# Patient Record
Sex: Male | Born: 1960 | Race: White | Hispanic: No | Marital: Married | Smoking: Never smoker
Health system: Southern US, Community
[De-identification: ages and names within clinical notes are randomized; demographics above are authoritative.]

## PROBLEM LIST (undated history)

## (undated) DIAGNOSIS — G4730 Sleep apnea, unspecified: Secondary | ICD-10-CM

## (undated) DIAGNOSIS — G43909 Migraine, unspecified, not intractable, without status migrainosus: Secondary | ICD-10-CM

## (undated) DIAGNOSIS — G2581 Restless legs syndrome: Secondary | ICD-10-CM

## (undated) DIAGNOSIS — I10 Essential (primary) hypertension: Secondary | ICD-10-CM

## (undated) DIAGNOSIS — J45909 Unspecified asthma, uncomplicated: Secondary | ICD-10-CM

## (undated) HISTORY — DX: Unspecified asthma, uncomplicated: J45.909

## (undated) HISTORY — PX: NASAL SEPTUM SURGERY: SHX37

---

## 2017-09-03 ENCOUNTER — Encounter (HOSPITAL_COMMUNITY): Payer: Self-pay | Admitting: Emergency Medicine

## 2017-09-03 DIAGNOSIS — Z79899 Other long term (current) drug therapy: Secondary | ICD-10-CM | POA: Insufficient documentation

## 2017-09-03 DIAGNOSIS — R569 Unspecified convulsions: Secondary | ICD-10-CM | POA: Insufficient documentation

## 2017-09-03 DIAGNOSIS — R55 Syncope and collapse: Secondary | ICD-10-CM | POA: Diagnosis not present

## 2017-09-03 LAB — BASIC METABOLIC PANEL
ANION GAP: 8 (ref 5–15)
BUN: 16 mg/dL (ref 6–20)
CHLORIDE: 105 mmol/L (ref 101–111)
CO2: 21 mmol/L — ABNORMAL LOW (ref 22–32)
Calcium: 8.7 mg/dL — ABNORMAL LOW (ref 8.9–10.3)
Creatinine, Ser: 1.28 mg/dL — ABNORMAL HIGH (ref 0.61–1.24)
GFR calc Af Amer: >60 mL/min (ref >60)
Glucose, Bld: 99 mg/dL (ref 65–99)
POTASSIUM: 3.6 mmol/L (ref 3.5–5.1)
SODIUM: 134 mmol/L — AB (ref 135–145)

## 2017-09-03 LAB — CBC
HEMATOCRIT: 43.6 % (ref 39.0–52.0)
HEMOGLOBIN: 15.3 g/dL (ref 13.0–17.0)
MCH: 31.1 pg (ref 26.0–34.0)
MCHC: 35.1 g/dL (ref 30.0–36.0)
MCV: 88.6 fL (ref 78.0–100.0)
Platelets: 244 10*3/uL (ref 150–400)
RBC: 4.92 MIL/uL (ref 4.22–5.81)
RDW: 12.5 % (ref 11.5–15.5)
WBC: 10.6 10*3/uL — AB (ref 4.0–10.5)

## 2017-09-03 LAB — CBG MONITORING, ED: GLUCOSE-CAPILLARY: 106 mg/dL — AB (ref 65–99)

## 2017-09-03 NOTE — ED Triage Notes (Signed)
Patient's spouse reported brief seizure episode this evening while at a restaurant , alert and oriented at arrival with no neuro deficits , speech clear/ no facial asymmetry , equal grips with no arm drift.

## 2017-09-04 ENCOUNTER — Emergency Department (HOSPITAL_COMMUNITY): Payer: BLUE CROSS/BLUE SHIELD

## 2017-09-04 ENCOUNTER — Emergency Department (HOSPITAL_COMMUNITY)
Admission: EM | Admit: 2017-09-04 | Discharge: 2017-09-04 | Disposition: A | Discharge status: Home / Self Care | Payer: BLUE CROSS/BLUE SHIELD | Attending: Emergency Medicine | Admitting: Emergency Medicine

## 2017-09-04 DIAGNOSIS — R55 Syncope and collapse: Secondary | ICD-10-CM

## 2017-09-04 DIAGNOSIS — R569 Unspecified convulsions: Secondary | ICD-10-CM

## 2017-09-04 HISTORY — DX: Migraine, unspecified, not intractable, without status migrainosus: G43.909

## 2017-09-04 HISTORY — DX: Sleep apnea, unspecified: G47.30

## 2017-09-04 HISTORY — DX: Restless legs syndrome: G25.81

## 2017-09-04 LAB — RAPID URINE DRUG SCREEN, HOSP PERFORMED
Amphetamines: NOT DETECTED
Barbiturates: NOT DETECTED
Benzodiazepines: NOT DETECTED
Cocaine: NOT DETECTED
OPIATES: NOT DETECTED
TETRAHYDROCANNABINOL: NOT DETECTED

## 2017-09-04 LAB — URINALYSIS, ROUTINE W REFLEX MICROSCOPIC
BILIRUBIN URINE: NEGATIVE
Glucose, UA: NEGATIVE mg/dL
Hgb urine dipstick: NEGATIVE
KETONES UR: NEGATIVE mg/dL
LEUKOCYTES UA: NEGATIVE
NITRITE: NEGATIVE
PH: 5 (ref 5.0–8.0)
Protein, ur: NEGATIVE mg/dL
Specific Gravity, Urine: 1.005 (ref 1.005–1.030)

## 2017-09-04 LAB — TROPONIN I

## 2017-09-04 NOTE — Discharge Instructions (Signed)
At this time it is unclear if you had a seizure or some other cause of loss of consciousness. He should not drive until you follow up with neurologist for further evaluation. Please call one of the listed neurology numbers to schedule an office visit.

## 2017-09-04 NOTE — ED Provider Notes (Signed)
[facility]-EMERGENCY DEPT Provider Note   CSN: [id number] Arrival date & time: 09/03/17  2040     History   Chief Complaint Chief Complaint  Patient presents with  . Seizures    HPI Calvin Fisher is a 56 y.o. male.  Patient presents to the ER for evaluation of possible seizure. Episode occurred earlier tonight. He was at a restaurant with his wife. Wife reports that he suddenly stated that he did not feel well, became very pale and looked ill. He sat back in his seat and then rested his head backwards on the wall. His eyes rolled up and he shook for approximately 15 seconds, then the shaking stopped. He did not bite his tongue or have urinary incontinence. After the shaking stopped he was awake and knew he was in a restaurant, stating that he still felt ill and told his wife to go ahead and eat without him. He did not appear to be confused or postictal at this time. He has no history of seizures. He did not have any chest pain, heart palpitations associated with the symptoms.      Past Medical History:  Diagnosis Date  . Migraine   . Restless leg syndrome   . Sleep apnea     There are no active problems to display for this patient.   Past Surgical History:  Procedure Laterality Date  . NASAL SEPTUM SURGERY         Home Medications    Prior to Admission medications   Medication Sig Start Date End Date Taking? Authorizing Provider  albuterol (PROVENTIL HFA;VENTOLIN HFA) 108 (90 Base) MCG/ACT inhaler Inhale 1-2 puffs into the lungs every 6 (six) hours as needed for wheezing or shortness of breath.   Yes [provider]  fluticasone (FLONASE) 50 MCG/ACT nasal spray Place 1-2 sprays into both nostrils daily.   Yes [provider]  isometheptene-acetaminophen-dichloralphenazone (MIDRIN) 65-100-325 MG capsule Take 1 capsule by mouth 4 (four) times daily as needed for migraine. Maximum 5 capsules in 12 hours for migraine headaches, 8 capsules in 24 hours for  tension headaches.   Yes [provider]  levocetirizine (XYZAL) 5 MG tablet Take 5 mg by mouth daily as needed for allergies.   Yes [provider]  rOPINIRole (REQUIP) 1 MG tablet Take 2 mg by mouth at bedtime.   Yes [provider]    Family History No family history on file.  Social History Social History  Substance Use Topics  . Smoking status: Never Smoker  . Smokeless tobacco: Never Used  . Alcohol use Yes     Allergies   Patient has no known allergies.   Review of Systems Review of Systems  Respiratory: Negative for shortness of breath.   Cardiovascular: Negative for chest pain and palpitations.  Gastrointestinal: Positive for nausea.  Neurological: Positive for syncope.  All other systems reviewed and are negative.    Physical Exam Updated Vital Signs BP 135/78   Pulse 69   Temp 97.9 F (36.6 C) (Oral)   Resp 13   Ht  (1.803 m)   Wt 108.9 kg (240 lb)   SpO2 94%   BMI 33.47 kg/m   Physical Exam  Constitutional: He is oriented to person, place, and time. He appears well-developed and well-nourished. No distress.  HENT:  Head: Normocephalic and atraumatic.  Right Ear: Hearing normal.  Left Ear: Hearing normal.  Nose: Nose normal.  Mouth/Throat: Oropharynx is clear and moist and mucous membranes are normal.  Eyes: Pupils are equal, round, and reactive to light. Conjunctivae and EOM are normal.  Neck: Normal range of motion. Neck supple.  Cardiovascular: Regular rhythm, S1 normal and S2 normal.  Exam reveals no gallop and no friction rub.   No murmur heard. Pulmonary/Chest: Effort normal and breath sounds normal. No respiratory distress. He exhibits no tenderness.  Abdominal: Soft. Normal appearance and bowel sounds are normal. There is no hepatosplenomegaly. There is no tenderness. There is no rebound, no guarding, no tenderness at McBurney's point and negative Murphy's sign. No hernia.  Musculoskeletal: Normal range of  motion.  Neurological: He is alert and oriented to person, place, and time. He has normal strength. No cranial nerve deficit or sensory deficit. Coordination normal. GCS eye subscore is 4. GCS verbal subscore is 5. GCS motor subscore is 6.  Skin: Skin is warm, dry and intact. No rash noted. No cyanosis.  Psychiatric: He has a normal mood and affect. His speech is normal and behavior is normal. Thought content normal.  Nursing note and vitals reviewed.    ED Treatments / Results  Labs (all labs ordered are listed, but only abnormal results are displayed) Labs Reviewed  BASIC METABOLIC PANEL - Abnormal; Notable for the following:       Result Value   Sodium 134 (*)    CO2 21 (*)    Creatinine, Ser 1.28 (*)    Calcium 8.7 (*)    All other components within normal limits  CBC - Abnormal; Notable for the following:    WBC 10.6 (*)    All other components within normal limits  CBG MONITORING, ED - Abnormal; Notable for the following:    Glucose-Capillary 106 (*)    All other components within normal limits  TROPONIN I  RAPID URINE DRUG SCREEN, HOSP PERFORMED  URINALYSIS, ROUTINE W REFLEX MICROSCOPIC    EKG  EKG Interpretation  Date/Time:  Friday September 03 2017 20:57:58 EDT Ventricular Rate:  70 PR Interval:  156 QRS Duration: 94 QT Interval:  380 QTC Calculation: 410 R Axis:   -42 Text Interpretation:  Normal sinus rhythm Left axis deviation Minimal voltage criteria for LVH, may be normal variant Possible Anterolateral infarct , age undetermined Abnormal ECG No previous tracing Confirmed by Gilda Crease [phone]) on 09/04/2017 1:17:04 AM       Radiology Ct Head Wo Contrast  Result Date: 09/04/2017 CLINICAL DATA:  56 y/o  M; new seizure. EXAM: CT HEAD WITHOUT CONTRAST TECHNIQUE: Contiguous axial images were obtained from the base of the skull through the vertex without intravenous contrast. COMPARISON:  None. FINDINGS: Brain: No evidence of acute infarction,  hemorrhage, hydrocephalus, extra-axial collection or mass lesion/mass effect. Vascular: No hyperdense vessel or unexpected calcification. Skull: Normal. Negative for fracture or focal lesion. Sinuses/Orbits: No acute finding. Other: None. IMPRESSION: Normal CT of the head for age. Electronically Signed   By: Mitzi Hansen M.D.   On: 09/04/2017 02:10    Procedures Procedures (including critical care time)  Medications Ordered in ED Medications - No data to display   Initial Impression / Assessment and Plan / ED Course  I have reviewed the triage vital signs and the nursing notes.  Pertinent labs & imaging results that were available during my care of the patient were reviewed by me and considered in my medical decision making (see chart for details).     Patient had a brief episode earlier tonight that wife thought might have been a seizure. He had an episode  of shaking that lasted for 15 seconds. He did not bite his tongue, there was no urinary incontinence. After 15 seconds he became alert again and there did not appear to be any postictal state. He did have some precursor symptoms of feeling nauseated, weak. Wife reports that his face became pale prior to the episode, possibly vasovagal episode. There was no chest pain, heart palpitations to raise concern for cardiac arrhythmia. He has no history of seizures. At this point it does not sound like this was a true seizure, but I cannot rule it out. His workup today has been unremarkable. He has been monitored for a period of time, no further seizure-like activity or syncope. If this was a first-time seizure, he does not require initiation of antiepileptics and can have further workup as an outpatient. If this was simple syncope, he does not have any risk factors that would require hospitalization at this time. Patient will be discharge, follow-up with neurology.  Final Clinical Impressions(s) / ED Diagnoses   Final diagnoses:    Seizure-like activity (HCC)  Syncope, unspecified syncope type    New Prescriptions New Prescriptions   No medications on file     Gilda Crease, MD 09/04/17 [phone]

## 2017-09-04 NOTE — ED Notes (Signed)
Delay in lab draw,  Pt not in room 

## 2018-06-10 ENCOUNTER — Other Ambulatory Visit: Payer: Self-pay | Admitting: Family Medicine

## 2018-06-10 ENCOUNTER — Ambulatory Visit
Admission: RE | Admit: 2018-06-10 | Discharge: 2018-06-10 | Disposition: A | Discharge status: Home / Self Care | Payer: BLUE CROSS/BLUE SHIELD | Source: Ambulatory Visit | Attending: Family Medicine | Admitting: Family Medicine

## 2018-06-10 DIAGNOSIS — R053 Chronic cough: Secondary | ICD-10-CM

## 2018-06-10 DIAGNOSIS — R05 Cough: Secondary | ICD-10-CM

## 2018-08-01 ENCOUNTER — Encounter (INDEPENDENT_AMBULATORY_CARE_PROVIDER_SITE_OTHER): Payer: Self-pay

## 2018-08-01 ENCOUNTER — Ambulatory Visit (INDEPENDENT_AMBULATORY_CARE_PROVIDER_SITE_OTHER): Payer: BLUE CROSS/BLUE SHIELD | Admitting: Allergy & Immunology

## 2018-08-01 ENCOUNTER — Encounter: Payer: Self-pay | Admitting: Allergy & Immunology

## 2018-08-01 VITALS — BP 124/76 | HR 71 | Temp 98.1°F | Resp 18 | Ht 70.0 in | Wt 244.4 lb

## 2018-08-01 DIAGNOSIS — J4520 Mild intermittent asthma, uncomplicated: Secondary | ICD-10-CM

## 2018-08-01 DIAGNOSIS — J302 Other seasonal allergic rhinitis: Secondary | ICD-10-CM | POA: Diagnosis not present

## 2018-08-01 DIAGNOSIS — J3089 Other allergic rhinitis: Secondary | ICD-10-CM

## 2018-08-01 MED ORDER — AZELASTINE HCL 0.1 % NA SOLN: 2.0000 | Freq: Two times a day (BID) | NASAL | 5 refills | Status: AC

## 2018-08-01 MED ORDER — MONTELUKAST SODIUM 10 MG PO TABS: 10.0000 mg | ORAL_TABLET | Freq: Every day | ORAL | 5 refills | Status: AC

## 2018-08-01 NOTE — Progress Notes (Signed)
NEW PATIENT  Date of Service/Encounter:  08/01/18  Referring provider: Hoyt KochYousef, Deema, MD (Inactive)   Assessment:   Mild intermittent asthma, uncomplicated  Seasonal and perennial allergic rhinitis (ragweed, trees, indoor molds, dust mites and cat)   Plan/Recommendations:   1. Mild intermittent asthma, uncomplicated -Lung testing looks good today. -Continue with albuterol 2 to 4 puffs every 4-6 hours as needed. -There is no need for controller medication at this time.  2. Seasonal and perennial allergic rhinitis - Testing today showed: ragweed, trees, indoor molds, dust mites and cat - Avoidance measures provided. - Continue with: Xyzal (levocetirizine) 5mg  tablet once daily and Flonase (fluticasone) two sprays per nostril daily - Start taking: Singulair (montelukast) 10mg  daily and Astelin (azelastine) 2 sprays per nostril 1-2 times daily as needed - You can use an extra dose of the antihistamine, if needed, for breakthrough symptoms.  - Consider nasal saline rinses 1-2 times daily to remove allergens from the nasal cavities as well as help with mucous clearance (this is especially helpful to do before the nasal sprays are given) - Consider allergy shots as a means of long-term control. - Allergy shots "re-train" and "reset" the immune system to ignore environmental allergens and decrease the resulting immune response to those allergens (sneezing, itchy watery eyes, runny nose, nasal congestion, etc).    - Allergy shots improve symptoms in 75-85% of patients.  - We can discuss more at the next appointment if the medications are not working for you.  3. Return in about 3 months (around 11/01/2018).  Subjective:   Calvin Fisher is a 57 y.o. male presenting today for evaluation of  Chief Complaint  Patient presents with  . Cough    Calvin DienerJerald Fisher has a history of the following: Patient Active Problem List   Diagnosis Date Noted  . Mild intermittent asthma,  uncomplicated 08/01/2018  . Seasonal and perennial allergic rhinitis 08/01/2018    History obtained from: chart review and patient.  Calvin DienerJerald Brys was referred by Hoyt KochYousef, Deema, MD (Inactive).     Calvin HampshireJerald is a 57 y.o. male presenting for an evaluation of a cough. He started having a cough in April 2019. There was a lot of drainage. He was treated with cough syrups and other medications including ProAir without improvement in his symptoms.  He was treated with prednisone at one point without improvement. He did get started on fluticasone with some improvement in his symptoms but not complete clearance. He does endorse some minor sneezing. He does report throat clearing. He was on Xyzal and took it off last week to prepare for the visit today. He has been on Xyzal and other antihistamines for years.   Asthma/Respiratory Symptom History: He is on ProAir as needed for 10 + years. Now he is not using a lot, but he was using it when he started coughing in April. He did not feel that the ProAir. He has not needed prednisone in years. Typically exercise triggers his asthma. He was never diagnosed with asthma as a child, but he did have problems with running and whatnot. He did have some problems with football land endurance.  Allergic Rhinitis Symptom History: He rarely gets antibiotics for sinus infections. He does have dogs and cats in the home, mostly due to his wife. He has never been tested for allergies.   He tolerates all of the food allergies without adverse event. He denies having a history of reflux symptoms at all. Otherwise, there is no history of other atopic  diseases, including drug allergies, stinging insect allergies, or urticaria. There is no significant infectious history. Vaccinations are up to date.    Past Medical History: Patient Active Problem List   Diagnosis Date Noted  . Mild intermittent asthma, uncomplicated 08/01/2018  . Seasonal and perennial allergic rhinitis 08/01/2018      Medication List:  Allergies as of 08/01/2018   No Known Allergies     Medication List        Accurate as of 08/01/18 10:32 PM. Always use your most recent med list.          albuterol 108 (90 Base) MCG/ACT inhaler Commonly known as:  PROVENTIL HFA;VENTOLIN HFA Inhale 1-2 puffs into the lungs every 6 (six) hours as needed for wheezing or shortness of breath.   amLODipine 10 MG tablet Commonly known as:  NORVASC TK 1 T PO QD   azelastine 0.1 % nasal spray Commonly known as:  ASTELIN Place 2 sprays into both nostrils 2 (two) times daily.   fluticasone 50 MCG/ACT nasal spray Commonly known as:  FLONASE Place 1-2 sprays into both nostrils daily.   levocetirizine 5 MG tablet Commonly known as:  XYZAL Take 5 mg by mouth daily as needed for allergies.   montelukast 10 MG tablet Commonly known as:  SINGULAIR Take 1 tablet (10 mg total) by mouth at bedtime.   rOPINIRole 1 MG tablet Commonly known as:  REQUIP Take 2 mg by mouth at bedtime.       Birth History: non-contributory.  Developmental History: non-contributory.   Past Surgical History: Past Surgical History:  Procedure Laterality Date  . NASAL SEPTUM SURGERY       Family History: History reviewed. No pertinent family history.   Social History: Calvin Fisher lives at home with his wife and youngest son. He works in a factory setting.  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  They live in a house which is a rental property built in the 1950s.  There is wood throughout the home.  They have gas heating and central cooling.  There is 1 dog and 2 cats in the home.  There are no dust mite covers on the bedding.  There is no tobacco exposure.  He has 4 children, 1 of whom still lives with him.  There is a 55 year old who is attending [employer] who lives with him.  He has wife have been married for 31 years.  He is originally from [place], [place]  and then moved to [place] before moving here around 2 years ago.    Review of Systems: a 14-point review of systems is pertinent for what is mentioned in HPI.  Otherwise, all other systems were negative. Constitutional: negative other than that listed in the HPI Eyes: negative other than that listed in the HPI Ears, nose, mouth, throat, and face: negative other than that listed in the HPI Respiratory: negative other than that listed in the HPI Cardiovascular: negative other than that listed in the HPI Gastrointestinal: negative other than that listed in the HPI Genitourinary: negative other than that listed in the HPI Integument: negative other than that listed in the HPI Hematologic: negative other than that listed in the HPI Musculoskeletal: negative other than that listed in the HPI Neurological: negative other than that listed in the HPI Allergy/Immunologic: negative other than that listed in the HPI    Objective:   Blood pressure 124/76, pulse 71, temperature 98.1 F (36.7 C),  temperature source Oral, resp. rate 18, height 5\' 10"  (1.778 m), weight 244 lb 6.4 oz (110.9 kg), SpO2 96 %. Body mass index is 35.07 kg/m.   Physical Exam:  General: Alert, interactive, in no acute distress. Pleasant male. Talkative.  Eyes: No conjunctival injection bilaterally, no discharge on the right, no discharge on the left and no Horner-Trantas dots present. PERRL bilaterally. EOMI without pain. No photophobia.  Ears: Right TM pearly gray with normal light reflex, Left TM pearly gray with normal light reflex, Right TM intact without perforation and Left TM intact without perforation.  Nose/Throat: External nose within normal limits and septum midline. Turbinates edematous and pale with clear discharge. Posterior oropharynx erythematous with cobblestoning in the posterior oropharynx. Tonsils 3+ without exudates.  Tongue without thrush. Neck: Supple without thyromegaly. Trachea  midline. Adenopathy: no enlarged lymph nodes appreciated in the anterior cervical, occipital, axillary, epitrochlear, inguinal, or popliteal regions. Lungs: Clear to auscultation without wheezing, rhonchi or rales. No increased work of breathing. CV: Normal S1/S2. No murmurs. Capillary refill <2 seconds.  Abdomen: Nondistended, nontender. No guarding or rebound tenderness. Bowel sounds present in all fields and hypoactive  Skin: Warm and dry, without lesions or rashes. Extremities:  No clubbing, cyanosis or edema. Neuro:   Grossly intact. No focal deficits appreciated. Responsive to questions.  Diagnostic studies:   Spirometry: results normal (FEV1: 3.46/92%, FVC: 3.94/82%, FEV1/FVC: 88%).    Spirometry consistent with normal pattern.   Allergy Studies:   Indoor/Outdoor Percutaneous Adult Environmental Panel: positive to short ragweed, Df mite and Dp mites. Otherwise negative with adequate controls.  Indoor/Outdoor Selected Intradermal Environmental Panel: positive to tree mix, mold mix #4 and cat. Otherwise negative with adequate controls.  Allergy testing results were read and interpreted by myself, documented by clinical staff.       Malachi Bonds, MD [facility]

## 2018-08-01 NOTE — Patient Instructions (Addendum)
1. Mild intermittent asthma, uncomplicated -Lung testing looks good today. -Continue with albuterol 2 to 4 puffs every 4-6 hours as needed. -There is no need for controller medication at this time.  2. Seasonal and perennial allergic rhinitis - Testing today showed: ragweed, trees, indoor molds, dust mites and cat - Avoidance measures provided. - Continue with: Xyzal (levocetirizine) 5mg  tablet once daily and Flonase (fluticasone) two sprays per nostril daily - Start taking: Singulair (montelukast) 10mg  daily and Astelin (azelastine) 2 sprays per nostril 1-2 times daily as needed - You can use an extra dose of the antihistamine, if needed, for breakthrough symptoms.  - Consider nasal saline rinses 1-2 times daily to remove allergens from the nasal cavities as well as help with mucous clearance (this is especially helpful to do before the nasal sprays are given) - Consider allergy shots as a means of long-term control. - Allergy shots "re-train" and "reset" the immune system to ignore environmental allergens and decrease the resulting immune response to those allergens (sneezing, itchy watery eyes, runny nose, nasal congestion, etc).    - Allergy shots improve symptoms in 75-85% of patients.  - We can discuss more at the next appointment if the medications are not working for you.  3. Return in about 3 months (around 11/01/2018).   Please inform us of any Emergency Department visits, hospitalizations, or changes in symptoms. Call us before going to the ED for breathing or allergy symptoms since we might be able to fit you in for a sick visit. Feel free to contact us anytime with any questions, problems, or concerns.  It was a pleasure to meet you today!  Websites that have reliable patient information: 1. American Academy of Asthma, Allergy, and Immunology: www.aaaai.org 2. Food Allergy Research and Education (FARE): foodallergy.org 3. Mothers of Asthmatics:  http://www.asthmacommunitynetwork.org 4. American College of Allergy, Asthma, and Immunology: MissingWeapons.cawww.acaai.org   Make sure you are registered to vote! If you have moved or changed any of your contact information, you will need to get this updated before voting!       Reducing Pollen Exposure  The American Academy of Allergy, Asthma and Immunology suggests the following steps to reduce your exposure to pollen during allergy seasons.    1. Do not hang sheets or clothing out to dry; pollen may collect on these items. 2. Do not mow lawns or spend time around freshly cut grass; mowing stirs up pollen. 3. Keep windows closed at night.  Keep car windows closed while driving. 4. Minimize morning activities outdoors, a time when pollen counts are usually at their highest. 5. Stay indoors as much as possible when pollen counts or humidity is high and on windy days when pollen tends to remain in the air longer. 6. Use air conditioning when possible.  Many air conditioners have filters that trap the pollen spores. 7. Use a HEPA room air filter to remove pollen form the indoor air you breathe.  Control of Mold Allergen   Mold and fungi can grow on a variety of surfaces provided certain temperature and moisture conditions exist.  Outdoor molds grow on plants, decaying vegetation and soil.  The major outdoor mold, Alternaria and Cladosporium, are found in very high numbers during hot and dry conditions.  Generally, a late Summer - Fall peak is seen for common outdoor fungal spores.  Rain will temporarily lower outdoor mold spore count, but counts rise rapidly when the rainy period ends.  The most important indoor molds are Aspergillus and Penicillium.  Dark, humid and poorly ventilated basements are ideal sites for mold growth.  The next most common sites of mold growth are the bathroom and the kitchen.   Indoor (Perennial) Mold Control   Positive indoor molds via skin testing: Fusarium, Aureobasidium  (Pullulara) and Rhizopus  1. Maintain humidity below 50%. 2. Clean washable surfaces with 5% bleach solution. 3. Remove sources e.g. contaminated carpets.     Control of House Dust Mite Allergen    House dust mites play a major role in allergic asthma and rhinitis.  They occur in environments with high humidity wherever human skin, the food for dust mites is found. High levels have been detected in dust obtained from mattresses, pillows, carpets, upholstered furniture, bed covers, clothes and soft toys.  The principal allergen of the house dust mite is found in its feces.  A gram of dust may contain 1,000 mites and 250,000 fecal particles.  Mite antigen is easily measured in the air during house cleaning activities.    1. Encase mattresses, including the box spring, and pillow, in an air tight cover.  Seal the zipper end of the encased mattresses with wide adhesive tape. 2. Wash the bedding in water of 130 degrees Farenheit weekly.  Avoid cotton comforters/quilts and flannel bedding: the most ideal bed covering is the dacron comforter. 3. Remove all upholstered furniture from the bedroom. 4. Remove carpets, carpet padding, rugs, and non-washable window drapes from the bedroom.  Wash drapes weekly or use plastic window coverings. 5. Remove all non-washable stuffed toys from the bedroom.  Wash stuffed toys weekly. 6. Have the room cleaned frequently with a vacuum cleaner and a damp dust-mop.  The patient should not be in a room which is being cleaned and should wait 1 hour after cleaning before going into the room. 7. Close and seal all heating outlets in the bedroom.  Otherwise, the room will become filled with dust-laden air.  An electric heater can be used to heat the room. 8. Reduce indoor humidity to less than 50%.  Do not use a humidifier.  Control of Dog or Cat Allergen  Avoidance is the best way to manage a dog or cat allergy. If you have a dog or cat and are allergic to dog or cats,  consider removing the dog or cat from the home. If you have a dog or cat but don't want to find it a new home, or if your family wants a pet even though someone in the household is allergic, here are some strategies that may help keep symptoms at bay:  1. Keep the pet out of your bedroom and restrict it to only a few rooms. Be advised that keeping the dog or cat in only one room will not limit the allergens to that room. 2. Don't pet, hug or kiss the dog or cat; if you do, wash your hands with soap and water. 3. High-efficiency particulate air (HEPA) cleaners run continuously in a bedroom or living room can reduce allergen levels over time. 4. Regular use of a high-efficiency vacuum cleaner or a central vacuum can reduce allergen levels. 5. Giving your dog or cat a bath at least once a week can reduce airborne allergen.  Control of House Dust Mite Allergen    House dust mites play a major role in allergic asthma and rhinitis.  They occur in environments with high humidity wherever human skin, the food for dust mites is found. High levels have been detected in dust obtained from  mattresses, pillows, carpets, upholstered furniture, bed covers, clothes and soft toys.  The principal allergen of the house dust mite is found in its feces.  A gram of dust may contain 1,000 mites and 250,000 fecal particles.  Mite antigen is easily measured in the air during house cleaning activities.    9. Encase mattresses, including the box spring, and pillow, in an air tight cover.  Seal the zipper end of the encased mattresses with wide adhesive tape. 10. Wash the bedding in water of 130 degrees Farenheit weekly.  Avoid cotton comforters/quilts and flannel bedding: the most ideal bed covering is the dacron comforter. 11. Remove all upholstered furniture from the bedroom. 12. Remove carpets, carpet padding, rugs, and non-washable window drapes from the bedroom.  Wash drapes weekly or use plastic window  coverings. 13. Remove all non-washable stuffed toys from the bedroom.  Wash stuffed toys weekly. 14. Have the room cleaned frequently with a vacuum cleaner and a damp dust-mop.  The patient should not be in a room which is being cleaned and should wait 1 hour after cleaning before going into the room. 15. Close and seal all heating outlets in the bedroom.  Otherwise, the room will become filled with dust-laden air.  An electric heater can be used to heat the room. 16. Reduce indoor humidity to less than 50%.  Do not use a humidifier.

## 2019-10-02 ENCOUNTER — Other Ambulatory Visit: Payer: Self-pay | Admitting: Registered"

## 2019-10-02 DIAGNOSIS — Z20822 Contact with and (suspected) exposure to covid-19: Secondary | ICD-10-CM

## 2019-10-03 LAB — NOVEL CORONAVIRUS, NAA: SARS-CoV-2, NAA: NOT DETECTED

## 2019-10-31 ENCOUNTER — Other Ambulatory Visit: Payer: Self-pay

## 2019-10-31 DIAGNOSIS — Z20822 Contact with and (suspected) exposure to covid-19: Secondary | ICD-10-CM

## 2019-11-01 LAB — NOVEL CORONAVIRUS, NAA: SARS-CoV-2, NAA: NOT DETECTED

## 2020-02-16 ENCOUNTER — Ambulatory Visit: Payer: BC Managed Care – PPO | Attending: Internal Medicine

## 2020-02-16 DIAGNOSIS — Z23 Encounter for immunization: Secondary | ICD-10-CM

## 2020-02-16 NOTE — Progress Notes (Signed)
   Covid-19 Vaccination Clinic  Name:  Calvin Fisher    MRN: [id number] DOB: October 28, 1961  02/16/2020  Mr. Standley was observed post Covid-19 immunization for 15 minutes without incident. He was provided with Vaccine Information Sheet and instruction to access the V-Safe system.   Mr. Quintanar was instructed to call 911 with any severe reactions post vaccine: Marland Kitchen Difficulty breathing  . Swelling of face and throat  . A fast heartbeat  . A bad rash all over body  . Dizziness and weakness   Immunizations Administered    Name Date Dose VIS Date Route   Moderna COVID-19 Vaccine 02/16/2020  9:44 AM 0.5 mL 11/07/2019 Intramuscular   Manufacturer: Moderna   Lot: [id number]   NDC: 01314-388-87

## 2020-03-09 ENCOUNTER — Other Ambulatory Visit: Payer: Self-pay | Admitting: Allergy & Immunology

## 2020-03-20 ENCOUNTER — Ambulatory Visit: Payer: BC Managed Care – PPO | Attending: Internal Medicine

## 2020-03-20 DIAGNOSIS — Z23 Encounter for immunization: Secondary | ICD-10-CM

## 2020-03-20 NOTE — Progress Notes (Signed)
   Covid-19 Vaccination Clinic  Name:  [name]    MRN: [id number] DOB: 1961/11/28  03/20/2020  [name] was observed post Covid-19 immunization for 15 minutes without incident. He was provided with Vaccine Information Sheet and instruction to access the V-Safe system.   [name] was instructed to call 911 with any severe reactions post vaccine: Marland Kitchen Difficulty breathing  . Swelling of face and throat  . A fast heartbeat  . A bad rash all over body  . Dizziness and weakness   Immunizations Administered    Name Date Dose VIS Date Route   Moderna COVID-19 Vaccine 03/20/2020  8:26 AM 0.5 mL 11/07/2019 Intramuscular   Manufacturer: Moderna   Lot: [id number]   NDC: 01314-388-87

## 2020-04-16 ENCOUNTER — Other Ambulatory Visit: Payer: Self-pay | Admitting: Physician Assistant

## 2020-04-16 DIAGNOSIS — R748 Abnormal levels of other serum enzymes: Secondary | ICD-10-CM

## 2020-04-23 ENCOUNTER — Ambulatory Visit
Admission: RE | Admit: 2020-04-23 | Discharge: 2020-04-23 | Disposition: A | Discharge status: Home / Self Care | Payer: BC Managed Care – PPO | Source: Ambulatory Visit | Attending: Physician Assistant | Admitting: Physician Assistant

## 2020-04-23 DIAGNOSIS — R748 Abnormal levels of other serum enzymes: Secondary | ICD-10-CM

## 2020-09-03 ENCOUNTER — Emergency Department
Admission: EM | Admit: 2020-09-03 | Discharge: 2020-09-03 | Disposition: A | Discharge status: Home / Self Care | Payer: BC Managed Care – PPO | Attending: Emergency Medicine | Admitting: Emergency Medicine

## 2020-09-03 ENCOUNTER — Other Ambulatory Visit: Payer: Self-pay

## 2020-09-03 ENCOUNTER — Emergency Department: Payer: BC Managed Care – PPO

## 2020-09-03 ENCOUNTER — Encounter: Payer: Self-pay | Admitting: Emergency Medicine

## 2020-09-03 DIAGNOSIS — Z79899 Other long term (current) drug therapy: Secondary | ICD-10-CM | POA: Diagnosis not present

## 2020-09-03 DIAGNOSIS — R079 Chest pain, unspecified: Secondary | ICD-10-CM

## 2020-09-03 DIAGNOSIS — I10 Essential (primary) hypertension: Secondary | ICD-10-CM | POA: Diagnosis not present

## 2020-09-03 DIAGNOSIS — J45909 Unspecified asthma, uncomplicated: Secondary | ICD-10-CM | POA: Diagnosis not present

## 2020-09-03 DIAGNOSIS — R0789 Other chest pain: Secondary | ICD-10-CM | POA: Diagnosis present

## 2020-09-03 HISTORY — DX: Essential (primary) hypertension: I10

## 2020-09-03 LAB — CBC
HCT: 46.8 % (ref 39.0–52.0)
Hemoglobin: 16.5 g/dL (ref 13.0–17.0)
MCH: 31.3 pg (ref 26.0–34.0)
MCHC: 35.3 g/dL (ref 30.0–36.0)
MCV: 88.6 fL (ref 80.0–100.0)
Platelets: 242 10*3/uL (ref 150–400)
RBC: 5.28 MIL/uL (ref 4.22–5.81)
RDW: 12.8 % (ref 11.5–15.5)
WBC: 7.5 10*3/uL (ref 4.0–10.5)
nRBC: 0 % (ref 0.0–0.2)

## 2020-09-03 LAB — TROPONIN I (HIGH SENSITIVITY)
Troponin I (High Sensitivity): 3 ng/L (ref <18)
Troponin I (High Sensitivity): 3 ng/L (ref <18)

## 2020-09-03 LAB — BASIC METABOLIC PANEL
Anion gap: 7 (ref 5–15)
BUN: 18 mg/dL (ref 6–20)
CO2: 26 mmol/L (ref 22–32)
Calcium: 9 mg/dL (ref 8.9–10.3)
Chloride: 104 mmol/L (ref 98–111)
Creatinine, Ser: 1.16 mg/dL (ref 0.61–1.24)
GFR calc Af Amer: >60 mL/min (ref >60)
GFR calc non Af Amer: >60 mL/min (ref >60)
Glucose, Bld: 145 mg/dL — ABNORMAL HIGH (ref 70–99)
Potassium: 4.2 mmol/L (ref 3.5–5.1)
Sodium: 137 mmol/L (ref 135–145)

## 2020-09-03 NOTE — ED Triage Notes (Signed)
Pt to ED via POV c/o chest pain that started this morning around 0830. Pt states that he was sitting at his desk when the pain started. Pt reports that he is still having pain but it has eased off some. Pt states that the pain feels like tightness. Pt denies hx/o cardiac issues. Pt does have high blood pressure. Pt is in NAD.

## 2020-09-03 NOTE — ED Provider Notes (Signed)
[facility] Emergency Department Provider Note  ____________________________________________  Time seen: Approximately 5:29 PM  I have reviewed the triage vital signs and the nursing notes.   HISTORY  Chief Complaint Chest Pain    HPI Calvin Fisher is a 59 y.o. male who presents the emergency department for evaluation of chest pain.  Patient states that he is experiencing chest tightness across the front of his chest.  He denies any trauma to the chest.  No shortness of breath.  He denies any URI symptoms of nasal congestion or cough.  Patient denies any pleuritic pain.  States that the pain is constant.  He does have a history of asthma but states that the tightness is different than an asthma exacerbation and he is having no respiratory symptoms.  This at the onset did radiate into his left shoulder.  When symptoms began, patient was sitting at his desk at work.  He left work and was seen in urgent care.  Patient states that he had nonspecific findings on his EKG and was referred to the emergency department for further evaluation.  Patient states that the symptoms have eased off somewhat but are still present currently.  He denies any headache, visual changes, neck pain or stiffness, abdominal pain, nausea vomiting or diarrhea.  No cardiac history.  Patient does have a history of asthma, hypertension, sleep apnea.         Past Medical History:  Diagnosis Date   Asthma    Hypertension    Migraine    Restless leg syndrome    Sleep apnea     Patient Active Problem List   Diagnosis Date Noted   Mild intermittent asthma, uncomplicated 08/01/2018   Seasonal and perennial allergic rhinitis 08/01/2018    Past Surgical History:  Procedure Laterality Date   NASAL SEPTUM SURGERY      Prior to Admission medications   Medication Sig Start Date End Date Taking? Authorizing Provider  albuterol (PROVENTIL HFA;VENTOLIN HFA) 108 (90 Base) MCG/ACT inhaler  Inhale 1-2 puffs into the lungs every 6 (six) hours as needed for wheezing or shortness of breath.    [provider]  amLODipine (NORVASC) 10 MG tablet TK 1 T PO QD 05/27/18   [provider]  azelastine (ASTELIN) 0.1 % nasal spray Place 2 sprays into both nostrils 2 (two) times daily. 08/01/18   Alfonse Spruce, MD  fluticasone [facility]) 50 MCG/ACT nasal spray Place 1-2 sprays into both nostrils daily.    [provider]  levocetirizine (XYZAL) 5 MG tablet Take 5 mg by mouth daily as needed for allergies.    [provider]  montelukast (SINGULAIR) 10 MG tablet Take 1 tablet (10 mg total) by mouth at bedtime. 08/01/18   Alfonse Spruce, MD  rOPINIRole (REQUIP) 1 MG tablet Take 2 mg by mouth at bedtime.    [provider]    Allergies Patient has no known allergies.  No family history on file.  Social History Social History   Tobacco Use   Smoking status: Never Smoker   Smokeless tobacco: Never Used  Building services engineer Use: Never used  Substance Use Topics   Alcohol use: Yes   Drug use: No     Review of Systems  Constitutional: No fever/chills Eyes: No visual changes. No discharge ENT: No upper respiratory complaints. Cardiovascular: Positive chest pain. Respiratory: no cough. No SOB. Gastrointestinal: No abdominal pain.  No nausea, no vomiting.  No diarrhea.  No constipation.  Musculoskeletal: Negative for musculoskeletal pain. Skin: Negative for rash, abrasions, lacerations, ecchymosis. Neurological: Negative for headaches, focal weakness or numbness. 10-point ROS otherwise negative.  ____________________________________________   PHYSICAL EXAM:  VITAL SIGNS: ED Triage Vitals  Enc Vitals Group     BP 09/03/20 1051 (!) 148/73     Pulse Rate 09/03/20 1051 80     Resp 09/03/20 1051 16     Temp 09/03/20 1051 98.9 F (37.2 C)     Temp Source 09/03/20 1051 Oral     SpO2 09/03/20 1051 97 %     Weight  09/03/20 1052 240 lb (108.9 kg)     Height 09/03/20 1052 5\' 10"  (1.778 m)     Head Circumference --      Peak Flow --      Pain Score 09/03/20 1052 4     Pain Loc --      Pain Edu? --      Excl. in GC? --      Constitutional: Alert and oriented. Well appearing and in no acute distress. Eyes: Conjunctivae are normal. PERRL. EOMI. Head: Atraumatic. ENT:      Ears:       Nose: No congestion/rhinnorhea.      Mouth/Throat: Mucous membranes are moist.  Neck: No stridor.   Cardiovascular: Normal rate, regular rhythm. Normal S1 and S2.  No murmurs, rubs, gallops.  Good peripheral circulation. Respiratory: Normal respiratory effort without tachypnea or retractions. Lungs CTAB. Good air entry to the bases with no decreased or absent breath sounds. Gastrointestinal: Bowel sounds 4 quadrants. Soft and nontender to palpation. No guarding or rigidity. No palpable masses. No distention.  Musculoskeletal: Full range of motion to all extremities. No gross deformities appreciated.  Visualization of the anterior chest wall reveals no deformities.  No tenderness to palpation along the anterior chest. Neurologic:  Normal speech and language. No gross focal neurologic deficits are appreciated.  Skin:  Skin is warm, dry and intact. No rash noted. Psychiatric: Mood and affect are normal. Speech and behavior are normal. Patient exhibits appropriate insight and judgement.   ____________________________________________   LABS (all labs ordered are listed, but only abnormal results are displayed)  Labs Reviewed  BASIC METABOLIC PANEL - Abnormal; Notable for the following components:      Result Value   Glucose, Bld 145 (*)    All other components within normal limits  CBC  TROPONIN I (HIGH SENSITIVITY)  TROPONIN I (HIGH SENSITIVITY)   ____________________________________________  EKG  ED ECG REPORT I, 09/05/20 Leeman Johnsey,  personally viewed and interpreted this ECG.   Date: 09/03/2020  EKG  Time: 1038 hrs.  Rate: 73 bpm  Rhythm: normal sinus rhythm, left axis deviation, incomplete right bundle branch block  Axis: Left axis deviation  Intervals:none  ST&T Change: No ST elevation or depression noted  Normal sinus rhythm.  No STEMI.  Incomplete right bundle branch block with RSR pattern in V1 but no widening of the QRS.  ____________________________________________  RADIOLOGY I personally viewed and evaluated these images as part of my medical decision making, as well as reviewing the written report by the radiologist.  DG Chest 2 View  Result Date: 09/03/2020 CLINICAL DATA:  Chest pain. EXAM: CHEST - 2 VIEW COMPARISON:  06/10/2018 FINDINGS: The heart size and mediastinal contours are within normal limits. Both lungs are clear. No pleural effusions. No pneumothorax. The visualized skeletal structures are unremarkable. IMPRESSION: No acute cardiopulmonary disease. Electronically Signed   By: 08/11/2018 MD  On: 09/03/2020 11:18    ____________________________________________    PROCEDURES  Procedure(s) performed:    Procedures    Medications - No data to display   ____________________________________________   INITIAL IMPRESSION / ASSESSMENT AND PLAN / ED COURSE  Pertinent labs & imaging results that were available during my care of the patient were reviewed by me and considered in my medical decision making (see chart for details).  Review of the [place] CSRS was performed in accordance of the [facility] prior to dispensing any controlled drugs.           Patient's diagnosis is consistent with nonspecific chest pain.  Patient presented to the emergency department complaining of chest tightness.  Patient states that symptoms began this morning.  Initially they radiated into his left shoulder.  Patient was seen in urgent care, had nonspecific EKG changes and was referred to the emergency department for further evaluation.  Patient's labs including troponin is  reassuring.  Chest x-ray is reassuring..  EKG revealed normal sinus rhythm with incomplete right bundle branch block.  No evidence of STEMI.  At this time I recommend follow-up with cardiology.  Return precautions are discussed with the patient.  Patient is given ED precautions to return to the ED for any worsening or new symptoms.     ____________________________________________  FINAL CLINICAL IMPRESSION(S) / ED DIAGNOSES  Final diagnoses:  Nonspecific chest pain      NEW MEDICATIONS STARTED DURING THIS VISIT:  ED Discharge Orders    None          This chart was dictated using voice recognition software/Dragon. Despite best efforts to proofread, errors can occur which can change the meaning. Any change was purely unintentional.    Racheal Patches, PA-C 09/03/20 1740    Arnaldo Natal, MD 09/03/20 2159

## 2021-08-07 IMAGING — US US ABDOMEN LIMITED
1 series · 14 of 25 positions shown · non-contrast
Comparison: None

CLINICAL DATA: Elevated LFTs

EXAM:
ULTRASOUND ABDOMEN LIMITED RIGHT UPPER QUADRANT

[Series 1: us abdomen limited · 0.17mm/px · 14 of 47 slices shown]
[im 1/47]
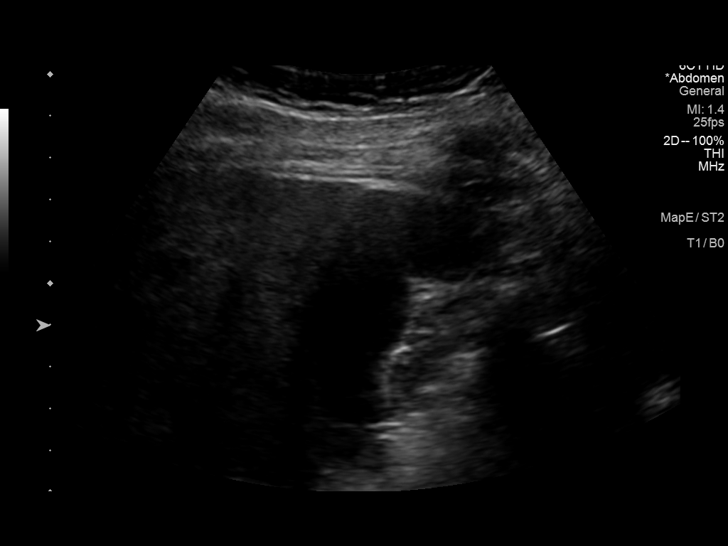
[im 4/47]
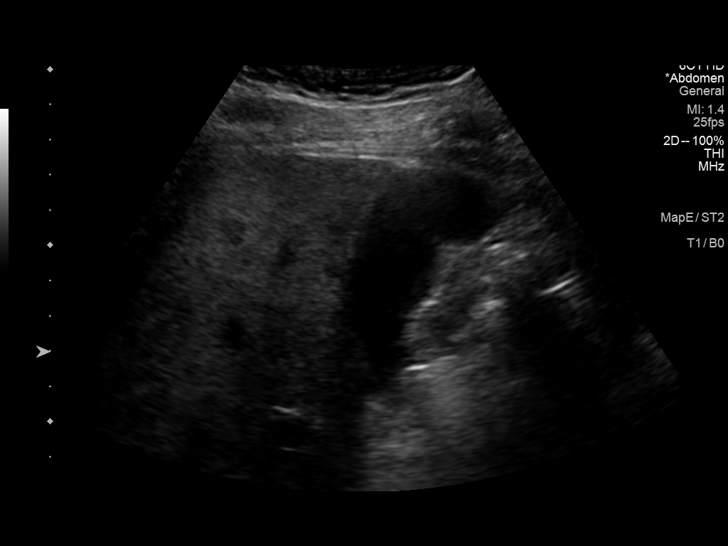
[im 8/47]
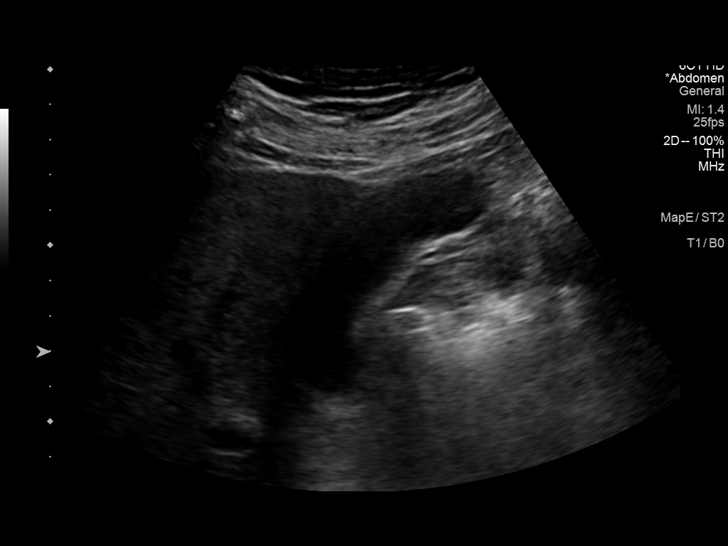
[im 12/47]
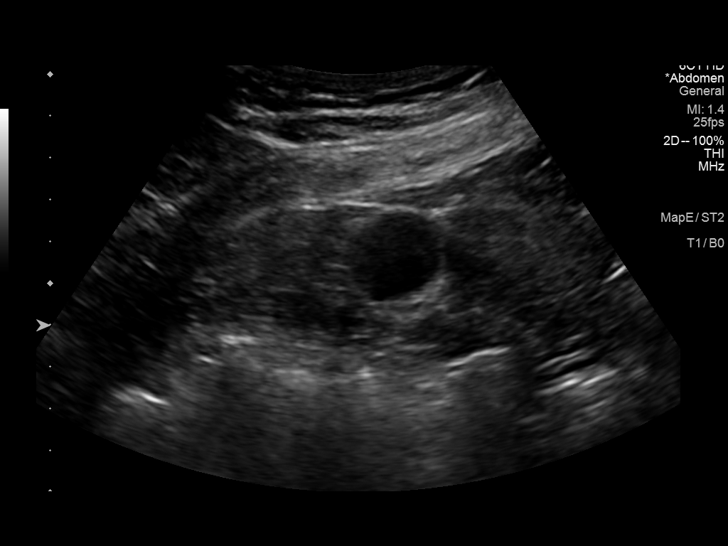
[im 16/47]
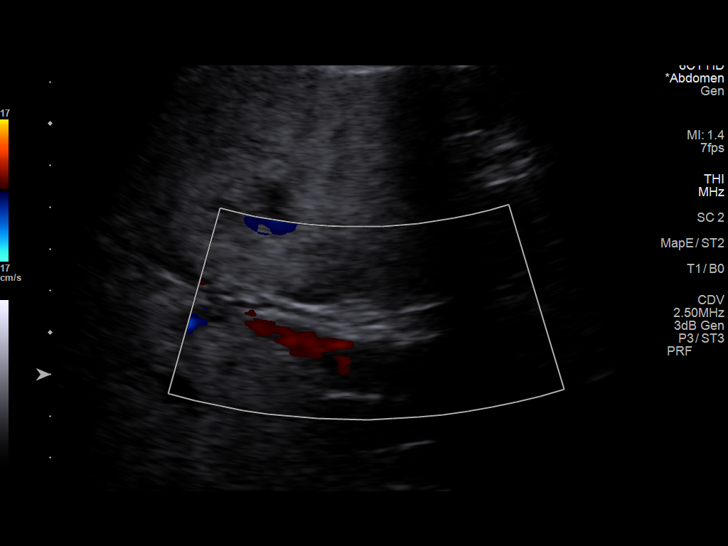
[im 18/47]
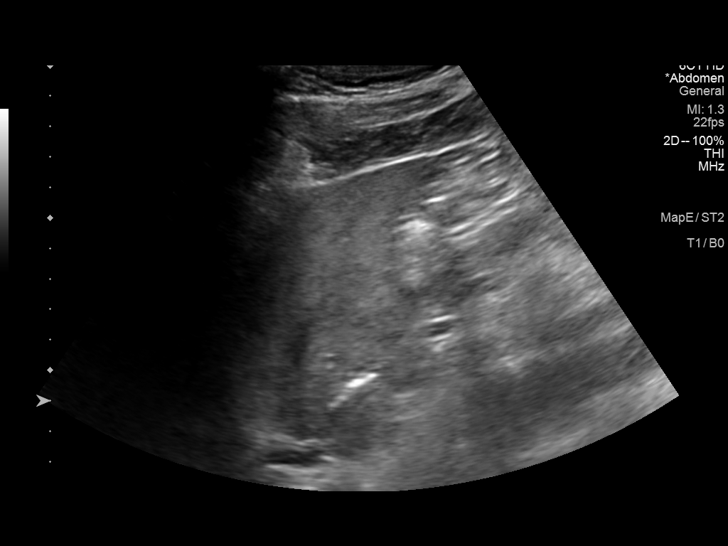
[im 22/47]
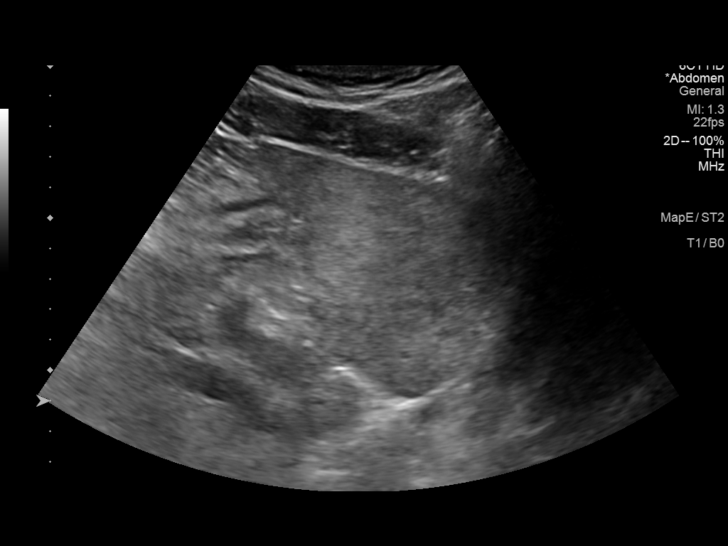
[im 25/47]
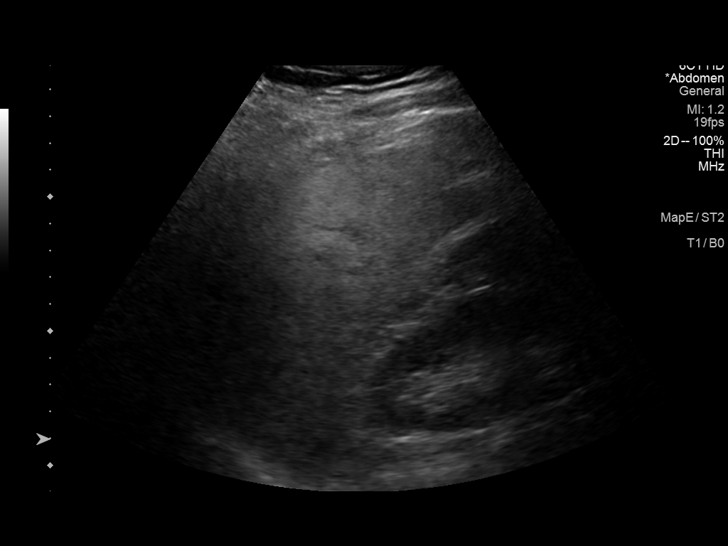
[im 29/47]
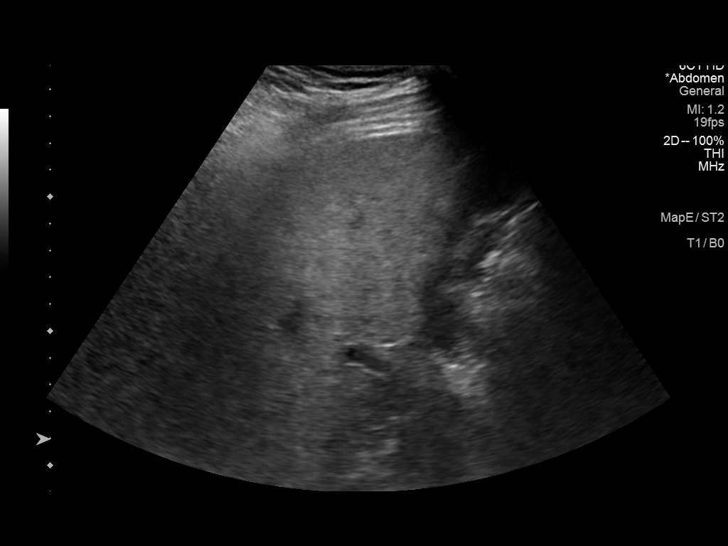
[im 31/47]
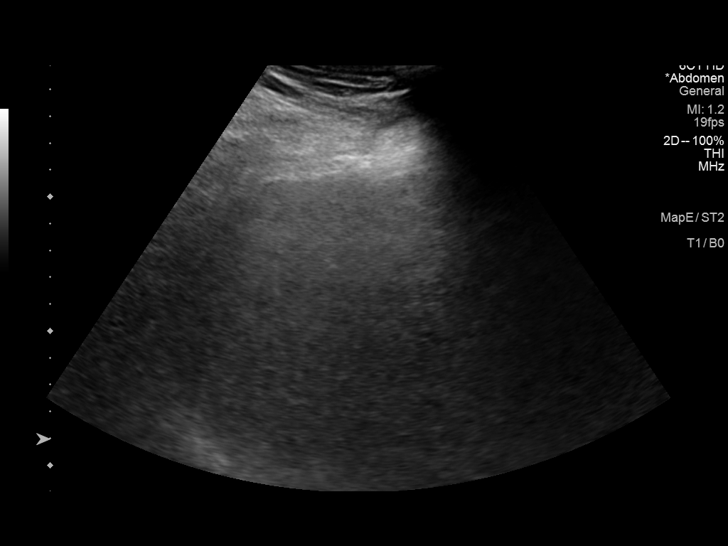
[im 35/47]
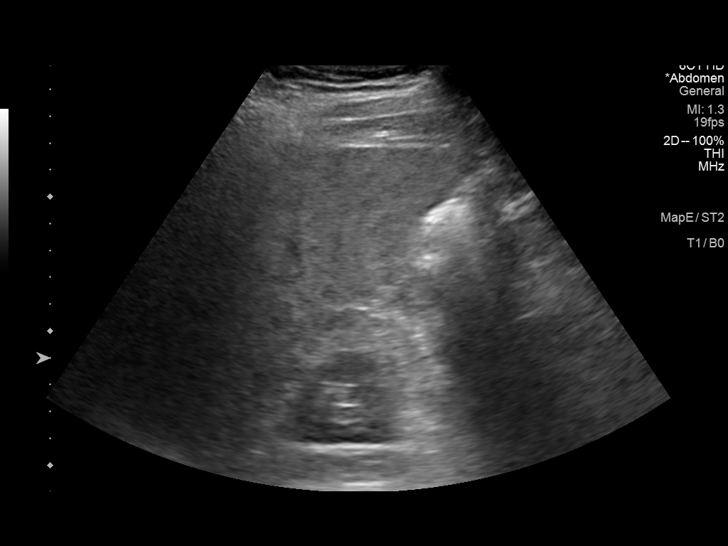
[im 39/47]
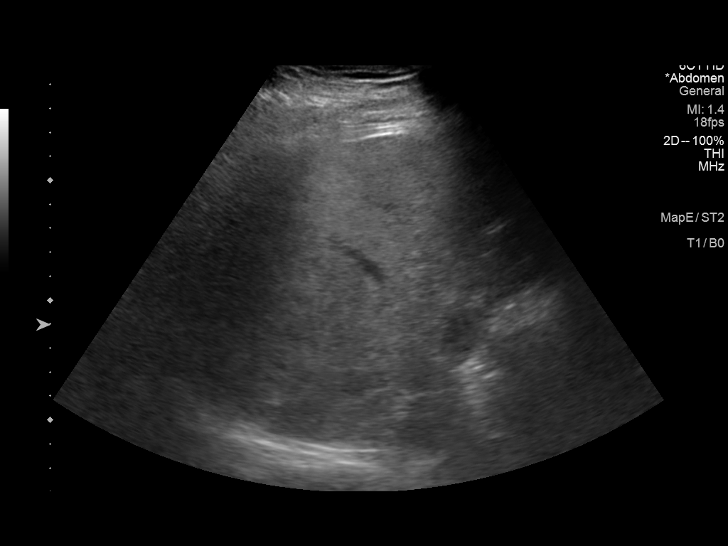
[im 43/47]
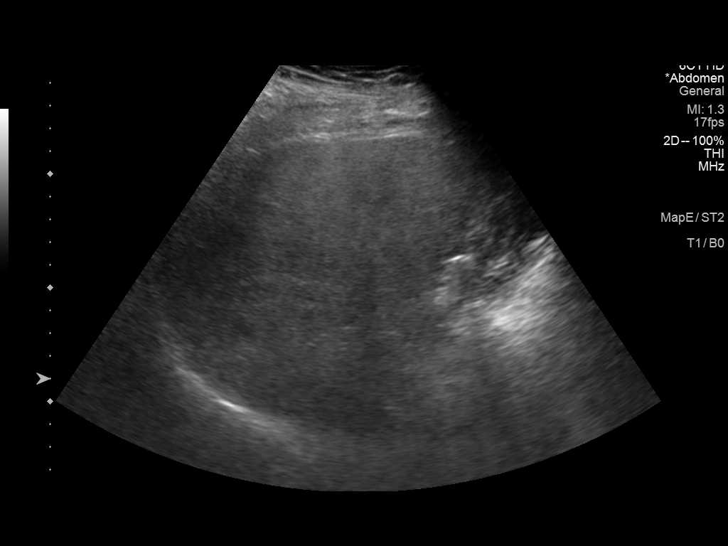
[im 47/47]
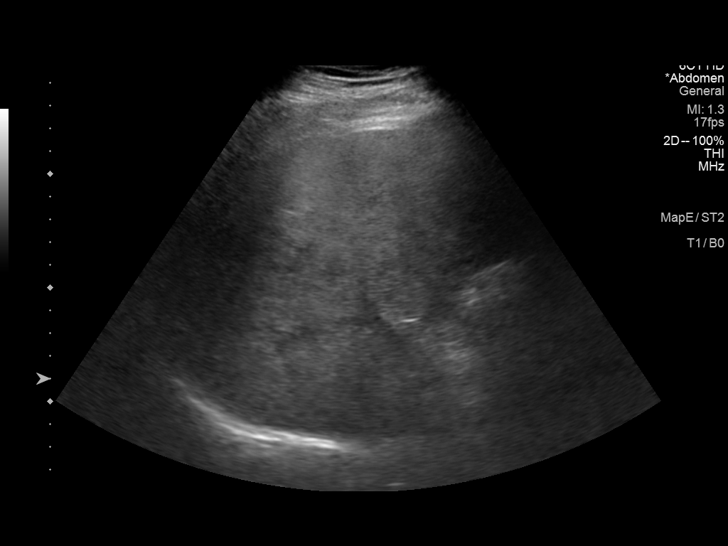

[14 of 25 positions shown; findings below may reference images not displayed]

FINDINGS: Gallbladder:

Normally distended without stones or wall thickening. No
pericholecystic fluid or sonographic Murphy sign.

Common bile duct:

Diameter: 3 mm, normal

Liver:

Echogenic parenchyma, likely fatty infiltration though this can be
seen with cirrhosis and certain infiltrative disorders. No focal
hepatic mass or nodularity. Portal vein is patent on color Doppler
imaging with normal direction of blood flow towards the liver.

Other: No RIGHT upper quadrant free fluid.
IMPRESSION: Probable fatty infiltration of liver as above.

Otherwise negative exam.

## 2021-12-18 IMAGING — CR DG CHEST 2V
2 series · 2 of 2 positions shown · non-contrast
Comparison: 06/10/2018

CLINICAL DATA: Chest pain.

EXAM:
CHEST - 2 VIEW

[chest pa]
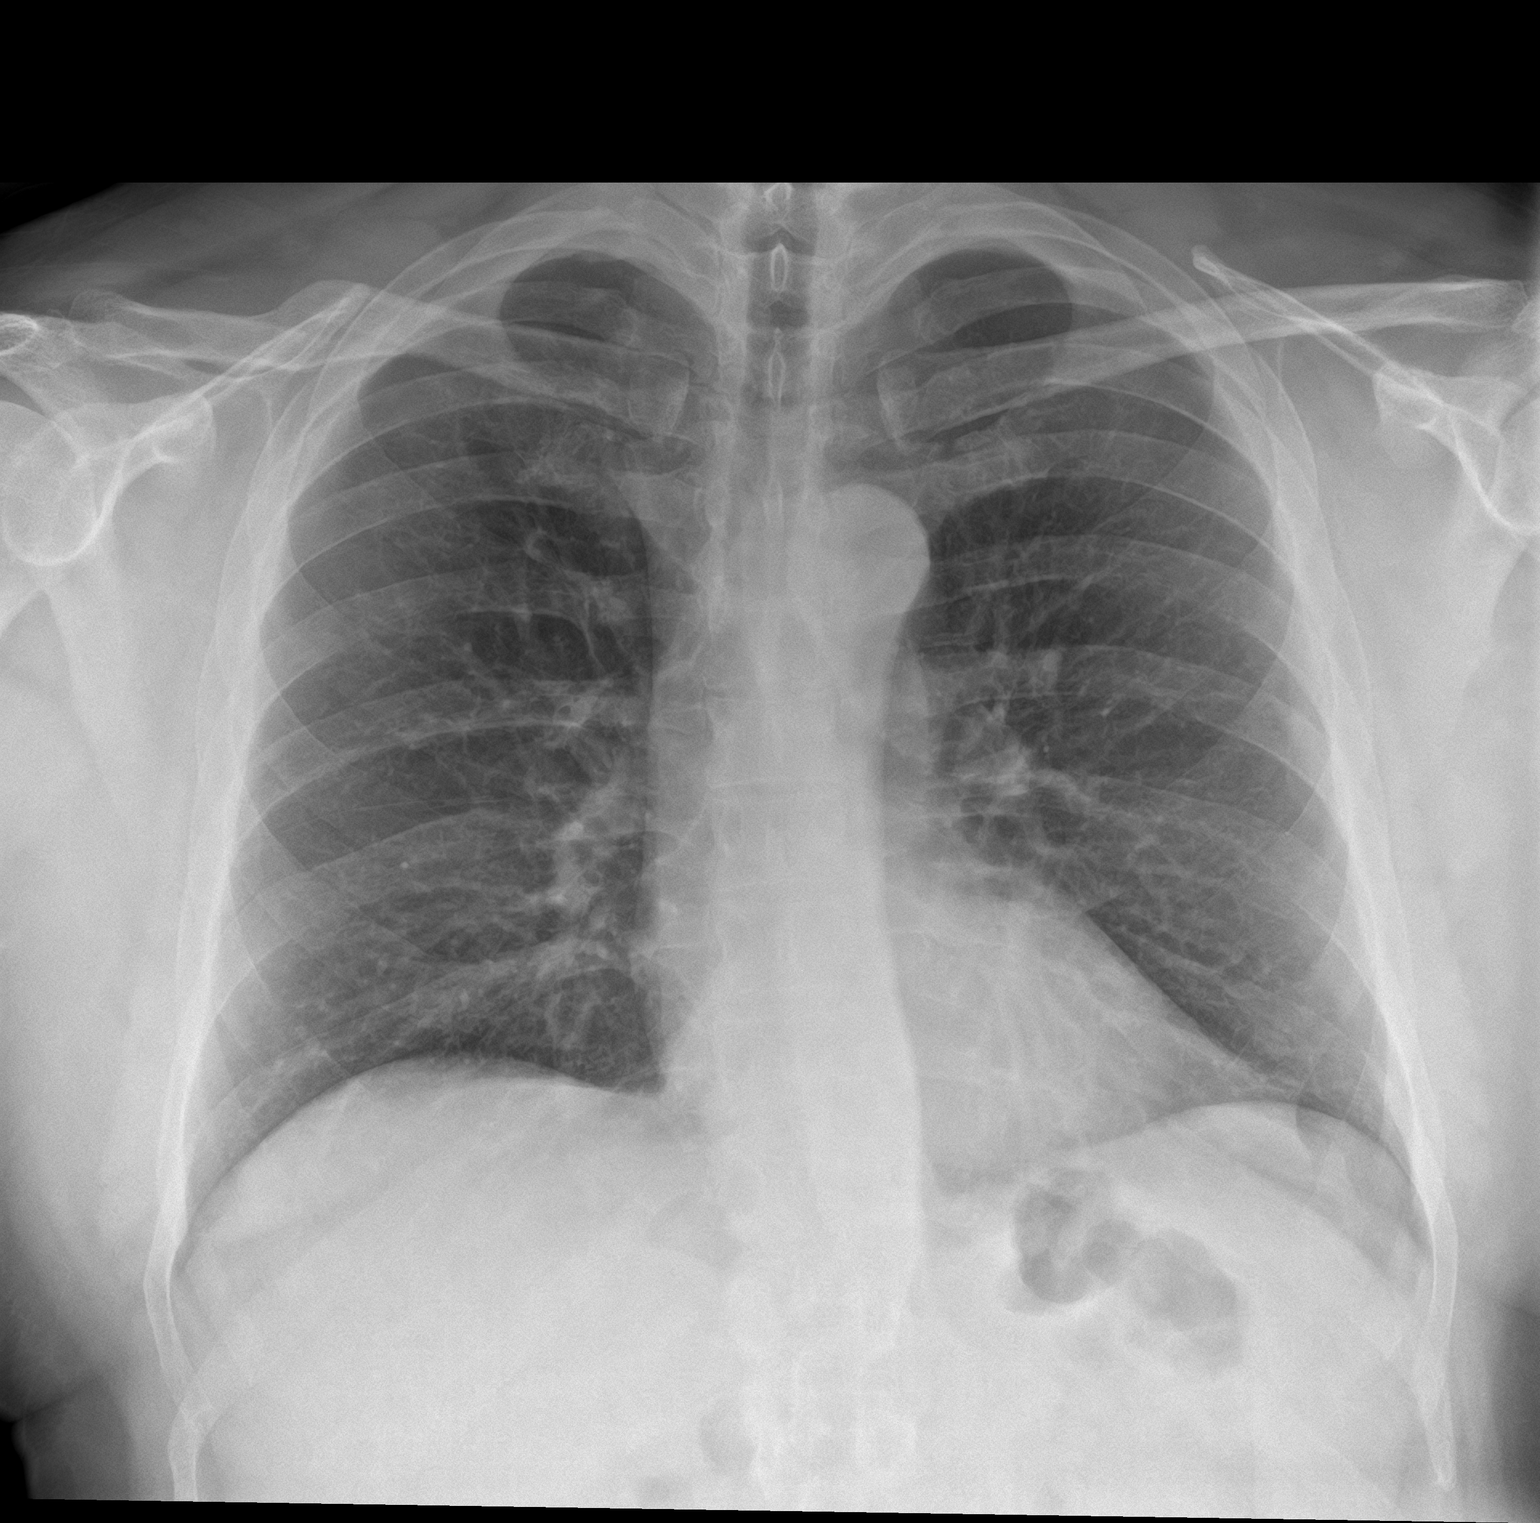

[chest lat]
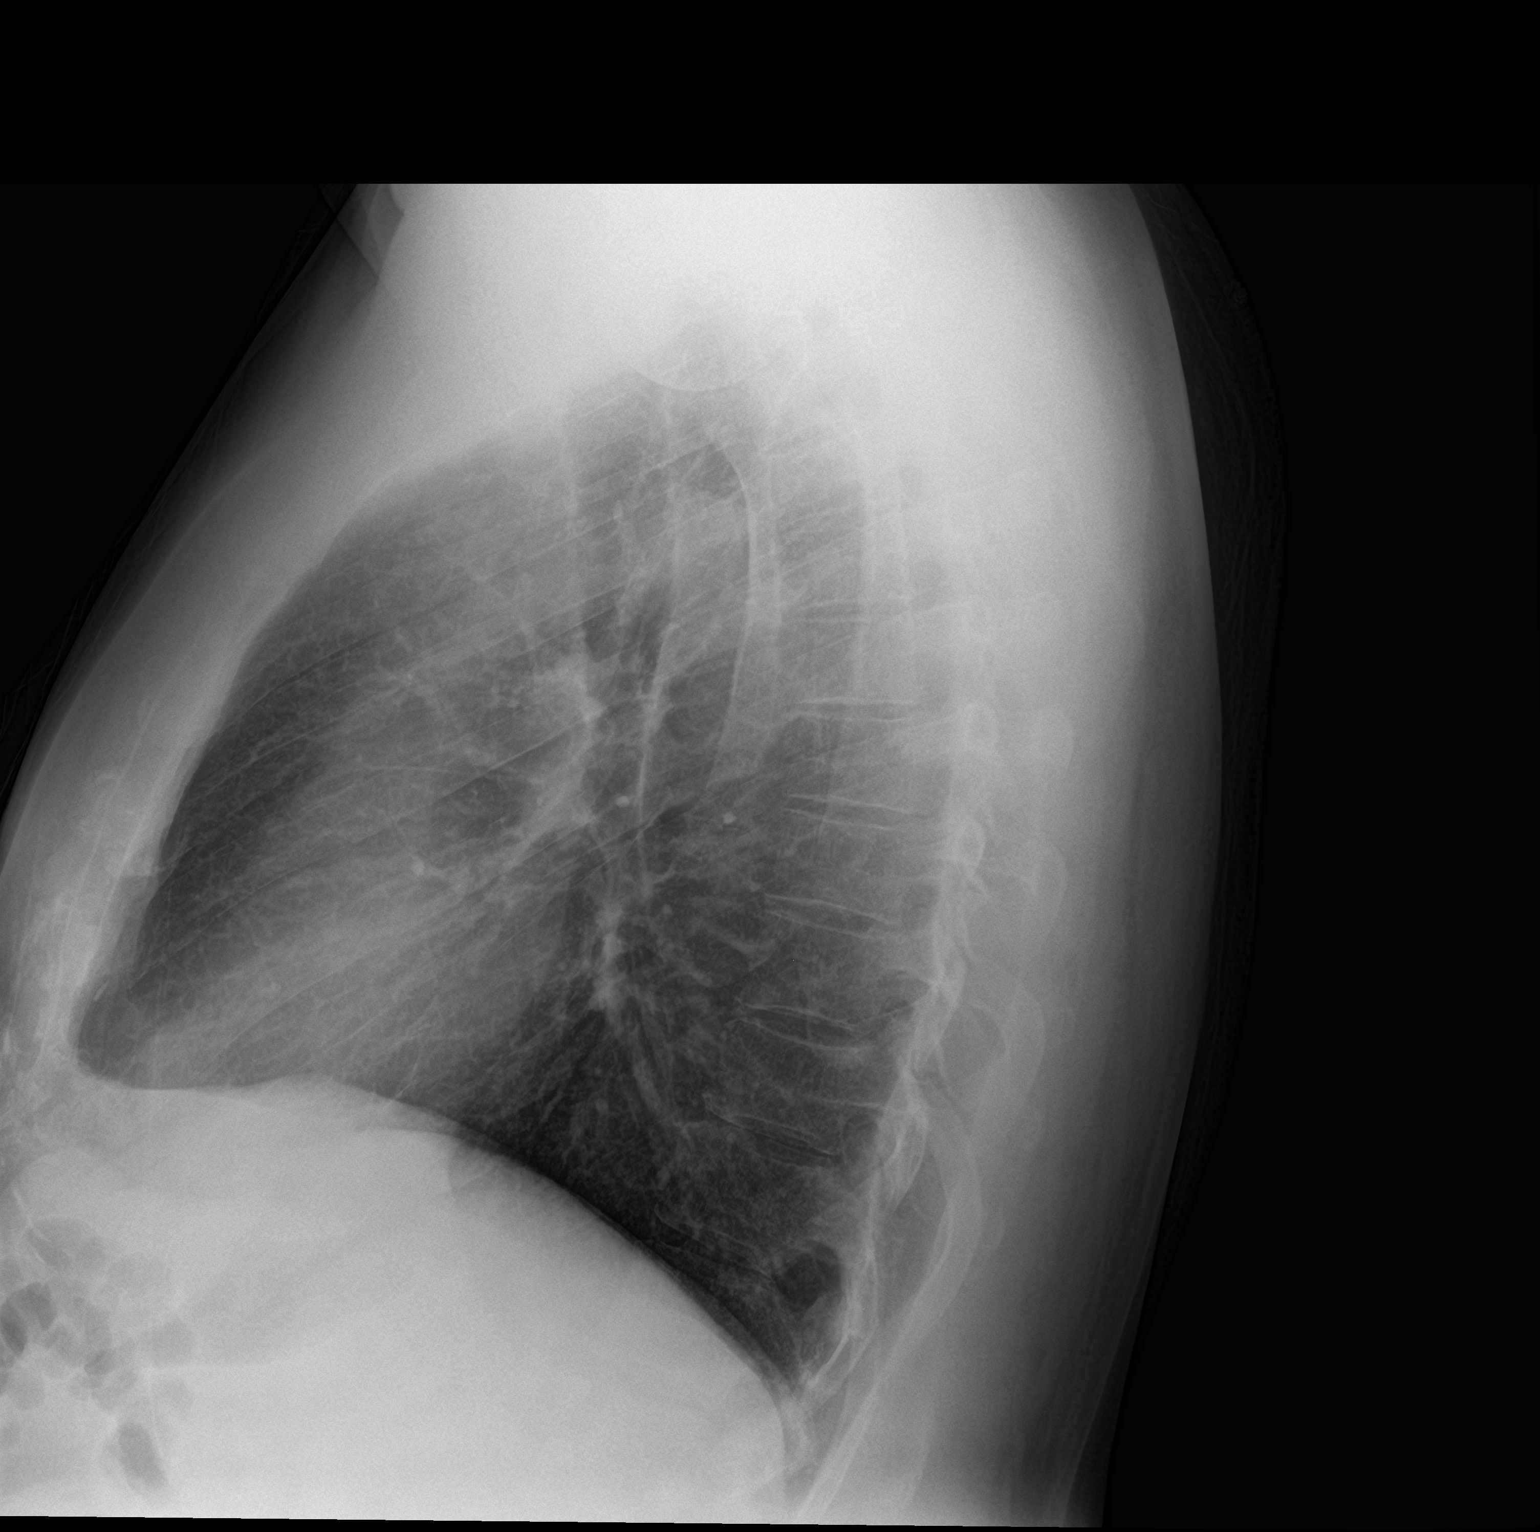

[2 of 2 positions shown; findings below may reference images not displayed]

FINDINGS: The heart size and mediastinal contours are within normal limits.
Both lungs are clear. No pleural effusions. No pneumothorax. The
visualized skeletal structures are unremarkable.
IMPRESSION: No acute cardiopulmonary disease.
